# Patient Record
Sex: Female | Born: 1985 | Race: Black or African American | Hispanic: No | Marital: Single | Smoking: Never smoker
Health system: Southern US, Community
[De-identification: ages and names within clinical notes are randomized; demographics above are authoritative.]

## PROBLEM LIST (undated history)

## (undated) ENCOUNTER — Emergency Department (HOSPITAL_COMMUNITY): Discharge status: Against Medical Advice | Payer: 59

## (undated) ENCOUNTER — Emergency Department: Discharge status: Absent without leave | Payer: Self-pay

## (undated) DIAGNOSIS — B009 Herpesviral infection, unspecified: Secondary | ICD-10-CM

## (undated) DIAGNOSIS — N63 Unspecified lump in unspecified breast: Secondary | ICD-10-CM

## (undated) DIAGNOSIS — D649 Anemia, unspecified: Secondary | ICD-10-CM

## (undated) HISTORY — PX: OTHER SURGICAL HISTORY: SHX169

## (undated) HISTORY — DX: Unspecified lump in unspecified breast: N63.0

## (undated) HISTORY — DX: Anemia, unspecified: D64.9

## (undated) HISTORY — DX: Herpesviral infection, unspecified: B00.9

---

## 2005-04-20 ENCOUNTER — Ambulatory Visit: Payer: Self-pay | Admitting: Internal Medicine

## 2005-05-15 ENCOUNTER — Ambulatory Visit: Payer: Self-pay | Admitting: Internal Medicine

## 2005-06-22 ENCOUNTER — Emergency Department: Payer: Self-pay | Admitting: Emergency Medicine

## 2005-06-26 ENCOUNTER — Inpatient Hospital Stay: Payer: Self-pay

## 2006-12-08 ENCOUNTER — Ambulatory Visit: Payer: Self-pay

## 2012-05-30 ENCOUNTER — Ambulatory Visit (INDEPENDENT_AMBULATORY_CARE_PROVIDER_SITE_OTHER): Payer: 59 | Admitting: Internal Medicine

## 2012-05-30 ENCOUNTER — Encounter: Payer: Self-pay | Admitting: Internal Medicine

## 2012-05-30 ENCOUNTER — Encounter: Payer: Self-pay | Admitting: *Deleted

## 2012-05-30 VITALS — BP 110/70 | HR 73 | Temp 97.9°F | Ht 71.0 in | Wt 144.0 lb

## 2012-05-30 DIAGNOSIS — R51 Headache: Secondary | ICD-10-CM

## 2012-05-30 DIAGNOSIS — R519 Headache, unspecified: Secondary | ICD-10-CM | POA: Insufficient documentation

## 2012-05-30 NOTE — Assessment & Plan Note (Signed)
Goes back for 6-9 months  Atypical for migraine---doesn't seem to be this No caffeine intake of note Doesn't seem to be stress headaches On OCP for about a year---switched from depo due to breakthrough bleeding. Is having period now  Discussed options Would recommend ibuprofen to try to abort May want to consider stopping OCP if they continue (?back to depo, or consider mirena)

## 2012-05-30 NOTE — Patient Instructions (Signed)
Please try ibuprofen for the headache if they bother you enough. If the headaches continue, you may need to consider changing off the birth control pill

## 2012-05-30 NOTE — Progress Notes (Signed)
Subjective:    Patient ID: Miranda Duncan, female    DOB: [birth year], 26 y.o.   MRN: [id number]  HPI Establishing care here  Has been having headache for 3-4 days Centered bifrontally Tried tylenol or ibuprofen without success Has had recurrent problems for 6-8 months Now more frequent Had visual spots in both eyes on Friday (3 days ago). No clear aura Pounding headaches No nausea Does have photophobia but no sonophobia Pain is worse when lying down then sitting. No increase with movement  Works at computer all day No neck stiffness or pain  Evaluated for fever and headaches at [facility] some years ago---LP negative No migraines  Drinks occ caffeine--but not daily Usually caffeine free sprite  Current Outpatient Prescriptions on File Prior to Visit  Medication Sig Dispense Refill  . norethindrone-ethinyl estradiol (TRIPHASIL,CYCLAFEM,ALYACEN) 0.5/0.75/1-35 MG-MCG tablet Take 1 tablet by mouth daily.        No Known Allergies  Past Medical History  Diagnosis Date  . Anemia     during pregnancy    No past surgical history on file.  Family History  Problem Relation Age of Onset  . Diabetes Paternal Grandmother   . Hypertension Paternal Grandmother   . Diabetes Paternal Grandfather   . Cancer Paternal Grandfather     prostate  . Heart disease Neg Hx   . Migraines Neg Hx     History   Social History  . Marital Status: Single    Spouse Name: N/A    Number of Children: 1  . Years of Education: N/A   Occupational History  . Customer service [employer]   Social History Main Topics  . Smoking status: Never Smoker   . Smokeless tobacco: Never Used  . Alcohol Use: Yes     rare--- once or twice a month  . Drug Use: No  . Sexually Active: Not on file   Other Topics Concern  . Not on file   Social History Narrative  . No narrative on file   Review of Systems  Constitutional: Negative for fever, fatigue and unexpected weight change.  HENT: Negative for  congestion, rhinorrhea and dental problem.        No allergy problems  Eyes: Positive for photophobia. Negative for visual disturbance.       No diplopia  Respiratory: Negative for cough and shortness of breath.   Cardiovascular: Negative for chest pain and leg swelling.  Gastrointestinal: Negative for nausea, vomiting and abdominal pain.  Genitourinary: Negative for dysuria and difficulty urinating.       Periods regular with OCP Long time on this Followed at [facility]  Musculoskeletal: Negative for back pain and arthralgias.  Skin: Negative for color change and rash.  Neurological: Positive for headaches. Negative for tremors, syncope, speech difficulty, weakness, light-headedness and numbness.  Hematological: Negative for adenopathy. Does not bruise/bleed easily.  Psychiatric/Behavioral: Negative for disturbed wake/sleep cycle and dysphoric mood. The patient is not nervous/anxious.        Generally sleeps okay       Objective:   Physical Exam  Constitutional: She appears well-developed and well-nourished. No distress.  HENT:  Head: Normocephalic and atraumatic.  Right Ear: External ear normal.  Left Ear: External ear normal.  Mouth/Throat: Oropharynx is clear and moist. No oropharyngeal exudate.  Eyes: Conjunctivae normal and EOM are normal. Pupils are equal, round, and reactive to light.       Fundi and discs normal  Neck: Normal range of motion. Neck supple. No thyromegaly  present.  Cardiovascular: Normal rate, regular rhythm, normal heart sounds and intact distal pulses.  Exam reveals no gallop.   No murmur heard. Pulmonary/Chest: Effort normal and breath sounds normal. No respiratory distress. She has no wheezes. She has no rales.  Abdominal: Soft. There is no tenderness.  Musculoskeletal: Normal range of motion. She exhibits no edema and no tenderness.  Lymphadenopathy:    She has no cervical adenopathy.  Neurological: She is alert. She has normal strength. She displays  no atrophy and no tremor. No cranial nerve deficit. She exhibits normal muscle tone. She displays a negative Romberg sign. Coordination and gait normal.  Skin: No rash noted. No erythema.  Psychiatric: She has a normal mood and affect. Her behavior is normal. Thought content normal.          Assessment & Plan:

## 2012-12-06 ENCOUNTER — Ambulatory Visit: Payer: Self-pay | Admitting: Obstetrics and Gynecology

## 2012-12-06 ENCOUNTER — Encounter: Payer: 59 | Admitting: Internal Medicine

## 2012-12-07 ENCOUNTER — Other Ambulatory Visit: Payer: Self-pay

## 2012-12-07 ENCOUNTER — Ambulatory Visit (INDEPENDENT_AMBULATORY_CARE_PROVIDER_SITE_OTHER): Payer: 59 | Admitting: General Surgery

## 2012-12-07 ENCOUNTER — Encounter: Payer: Self-pay | Admitting: General Surgery

## 2012-12-07 VITALS — BP 110/72 | HR 72 | Resp 12 | Ht 71.0 in | Wt 146.0 lb

## 2012-12-07 DIAGNOSIS — N63 Unspecified lump in unspecified breast: Secondary | ICD-10-CM

## 2012-12-07 DIAGNOSIS — D241 Benign neoplasm of right breast: Secondary | ICD-10-CM

## 2012-12-07 DIAGNOSIS — D249 Benign neoplasm of unspecified breast: Secondary | ICD-10-CM

## 2012-12-07 HISTORY — PX: BREAST BIOPSY: SHX20

## 2012-12-07 NOTE — Patient Instructions (Addendum)

## 2012-12-07 NOTE — Progress Notes (Signed)
Patient ID: Miranda Duncan, female   DOB: Aug 17, 1986, 27 y.o.   MRN: [id number]  Chief Complaint  Patient presents with  . Breast Problem    HPI Miranda Duncan is a 27 y.o. female.  Patient here today for right breast evaluation.  Patient with known history of "fibroids" in breast since 2006.  No biopsies done.  Patient has previously felt this "lump" and has not noticed any changes in size. Ultrasound done 12-06-12.  No family history of breast cancer. HPI  Past Medical History  Diagnosis Date  . Anemia     during pregnancy    History reviewed. No pertinent past surgical history.  Family History  Problem Relation Age of Onset  . Diabetes Paternal Grandmother   . Hypertension Paternal Grandmother   . Diabetes Paternal Grandfather   . Cancer Paternal Grandfather     prostate  . Heart disease Neg Hx   . Migraines Neg Hx     Social History History  Substance Use Topics  . Smoking status: Never Smoker   . Smokeless tobacco: Never Used  . Alcohol Use: Yes     Comment: rare--- once or twice a month    No Known Allergies  Current Outpatient Prescriptions  Medication Sig Dispense Refill  . ibuprofen (ADVIL,MOTRIN) 200 MG tablet Take 400-600 mg by mouth 3 (three) times daily as needed. For headache      . TRI-SPRINTEC 0.18/0.215/0.25 MG-35 MCG tablet Take 1 tablet by mouth daily.       No current facility-administered medications for this visit.    Review of Systems Review of Systems  Constitutional: Negative.   Respiratory: Negative.   Cardiovascular: Negative.     Blood pressure 110/72, pulse 72, resp. rate 12, height 5\' 11"  (1.803 m), weight 146 lb (66.225 kg), last menstrual period 11/28/2012.  Physical Exam Physical Exam  Constitutional: She is oriented to person, place, and time. She appears well-developed and well-nourished.  Cardiovascular: Normal rate and regular rhythm.   Pulmonary/Chest: Effort normal and breath sounds normal. Right breast exhibits  mass. Right breast exhibits no inverted nipple, no nipple discharge, no skin change and no tenderness. Left breast exhibits no inverted nipple, no mass, no nipple discharge, no skin change and no tenderness.  Lymphadenopathy:    She has no cervical adenopathy.    She has no axillary adenopathy.  Neurological: She is alert and oriented to person, place, and time.  Skin: Skin is warm and dry.   4 areas in right breast identified on clinical exam Data Reviewed Ultrasound examination of the right breast dated 12/06/2012 described a 0.9 x 2.8 x 4.2 cm well circumscribed hypoechoic oval mass at the 7:00 position possible internal calcifications. BIRAD-4.  Ultrasound examination of the right breast dated 04/13/2005 identified a 2.2 x 2.6 x 3.7 cm hypoechoic mass at the 7:00 position. Lesion at the 9:00 position measured 0.9 x 1.2 x 1.6 cm at the 11:00 position 1.8 x 2.4 x 3.5 cm and at the 5:00 position a 1.4 x 1.7 x 1.9 cm mass. These all had a similar appearance of echo architecture.   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    Plan    The patient is a candidate for core biopsy to confirm the clinical impression of a benign process. The core biopsy procedure was reviewed with the patient and she was amenable to proceed today       [name] 12/10/2012, 9:48 AM

## 2012-12-08 ENCOUNTER — Telehealth: Payer: Self-pay | Admitting: General Surgery

## 2012-12-08 LAB — PATHOLOGY

## 2012-12-08 NOTE — Telephone Encounter (Signed)
Patient notified that the biopsy of the largest mass in the right breast completed yesterday confirmed the clinical impression of a fibroadenoma. She reported minimal discomfort from the procedure. She will follow up with the nurse in one week for a wound check, Arrangements are in place for a follow office exam and ultrasound in one year. The patient will call if any new areas of concern develop.

## 2012-12-10 ENCOUNTER — Encounter: Payer: Self-pay | Admitting: General Surgery

## 2012-12-10 DIAGNOSIS — D249 Benign neoplasm of unspecified breast: Secondary | ICD-10-CM | POA: Insufficient documentation

## 2012-12-10 NOTE — Procedures (Signed)
Ultrasound examination of the right breast was completed.   At the 4:00 position 6 cm from the nipple a 0.7 x 2.7 x 3.4 cm hypoechoic mass with smooth borders was identified. This was previously identified as a 1.4 x 1.7 x 1.9 cm mass in 2006.  At the 7:00 position the index lesion prompting referral measured 1.1 x 3.4 x 4.0 cm 4 cm from the nipple. Previously this had measured 2.3 x 2.6 x 3.7 cm in 2006. This lesion did undergo core biopsy today.  At the 8:00 position a 0.7 x 1.5 x 1.9 cm mass was identified 6 cm from the nipple, previously this measure 1.0 x 1.2 x 1.6 cm in 2006.   At the 10:00 position a 0.9 x 3.0 x 3.2 cm hypoechoic mass with smooth borders was identified 8 cm from the nipple. This previously 1.8 x 2.4 x 3.5 cm in 2006.   The breast biopsy procedure was reviewed with the patient. The potential for bleeding, infection, and pain was reviewed. At this time, the benefits outweigh the risk and the patient is amenable to proceed. FINESSE BREAST BIOPSY REPORT  Name:  MAGALY POLLINA DOB:  April 22, 1986  Vitals: BP 110/72  Pulse 72  Resp 12  Ht 5\' 11"  (1.803 m)  Wt 146 lb (66.225 kg)  BMI 20.37 kg/m2  LMP 11/28/2012  Lesion:  Multiple right breast lesions.  Location:  Right, 4:00, 7:00, 8:00 and 10:00 positions. Local anesthetic:   10cc's of 0.5% Xylocaine/0.25% Marcaine with 1:200,000 epinephrine and NaH2CO3   Prep:  Betadine solution  Finesse biopsy device:  14 gauge  Patient tolerance:  Patient tolerated the procedure well   Approach: LM  Dressing: Benzoin, Steri-Strips, Telfa and Tegaderm  Clip: Not utilized  Ice pack applied. Written instructions provided to patient regarding wound care.   Patient advised that patient will be contacted by phone when pathology report is available.  Follow blood has been scheduled.   CC: Tillman Abide, MD, Alda Lea, Merrily Pew

## 2012-12-14 ENCOUNTER — Ambulatory Visit (INDEPENDENT_AMBULATORY_CARE_PROVIDER_SITE_OTHER): Payer: 59 | Admitting: *Deleted

## 2012-12-14 DIAGNOSIS — D249 Benign neoplasm of unspecified breast: Secondary | ICD-10-CM

## 2012-12-14 DIAGNOSIS — D241 Benign neoplasm of right breast: Secondary | ICD-10-CM

## 2012-12-14 NOTE — Progress Notes (Signed)
Patient here today for follow up post right breast biopsy.  Dressing removed, steristrip in place and aware it may come off in one week.  Minimal to no bruising noted.  The patient is aware that a heating pad may be used for comfort as needed.  Aware of pathology. Follow up as scheduled one year.

## 2012-12-14 NOTE — Patient Instructions (Signed)
Follow-up in one year.

## 2013-01-25 ENCOUNTER — Encounter: Payer: 59 | Admitting: Internal Medicine

## 2013-12-12 ENCOUNTER — Other Ambulatory Visit: Payer: 59

## 2013-12-12 ENCOUNTER — Encounter: Payer: Self-pay | Admitting: General Surgery

## 2013-12-12 ENCOUNTER — Ambulatory Visit (INDEPENDENT_AMBULATORY_CARE_PROVIDER_SITE_OTHER): Payer: 59 | Admitting: General Surgery

## 2013-12-12 VITALS — BP 104/70 | HR 68 | Resp 12 | Ht 71.0 in | Wt 148.0 lb

## 2013-12-12 DIAGNOSIS — N63 Unspecified lump in unspecified breast: Secondary | ICD-10-CM

## 2013-12-12 DIAGNOSIS — D241 Benign neoplasm of right breast: Secondary | ICD-10-CM

## 2013-12-12 DIAGNOSIS — D249 Benign neoplasm of unspecified breast: Secondary | ICD-10-CM

## 2013-12-12 NOTE — Progress Notes (Signed)
Patient ID: AAIRAH NEGRETTE, female   DOB: Sep 14, 1986, 28 y.o.   MRN: [id number]  No chief complaint on file.   HPI Miranda Duncan is a 28 y.o. female.  Here today for follow up right breast mass and ultrasiound.  She states it has not changed in size. No new breast complaints.   HPI  Past Medical History  Diagnosis Date  . Anemia     during pregnancy    Past Surgical History  Procedure Laterality Date  . Breast biopsy Right 12-07-12    benign    Family History  Problem Relation Age of Onset  . Diabetes Paternal Grandmother   . Hypertension Paternal Grandmother   . Diabetes Paternal Grandfather   . Cancer Paternal Grandfather     prostate  . Heart disease Neg Hx   . Migraines Neg Hx     Social History History  Substance Use Topics  . Smoking status: Never Smoker   . Smokeless tobacco: Never Used  . Alcohol Use: Yes     Comment: rare--- once or twice a month    No Known Allergies  Current Outpatient Prescriptions  Medication Sig Dispense Refill  . Vitamin D, Ergocalciferol, (DRISDOL) 50000 UNITS CAPS capsule Take 50,000 Units by mouth every 7 (seven) days.      Marland Kitchen ibuprofen (ADVIL,MOTRIN) 200 MG tablet Take 400-600 mg by mouth 3 (three) times daily as needed. For headache      . TRI-SPRINTEC 0.18/0.215/0.25 MG-35 MCG tablet Take 1 tablet by mouth daily.       No current facility-administered medications for this visit.    Review of Systems Review of Systems  Constitutional: Negative.   Respiratory: Negative.   Cardiovascular: Negative.     Blood pressure 104/70, pulse 68, resp. rate 12, height 5\' 11"  (1.803 m), weight 148 lb (67.132 kg), last menstrual period 12/10/2013.  Physical Exam Physical Exam  Constitutional: She is oriented to person, place, and time. She appears well-developed and well-nourished.  Neck: Neck supple.  Cardiovascular: Normal rate, regular rhythm and normal heart sounds.   Pulmonary/Chest: Effort normal and breath sounds  normal. Right breast exhibits mass. Right breast exhibits no inverted nipple, no nipple discharge, no skin change and no tenderness. Left breast exhibits no inverted nipple, no mass, no nipple discharge, no skin change and no tenderness.  Left < right breast about 1/4 cup size Right breast lump at 5 o'clock and 7- 8 o'clock 10 o'clock   Lymphadenopathy:    She has no cervical adenopathy.    She has no axillary adenopathy.  Neurological: She is alert and oriented to person, place, and time.  Skin: Skin is warm and dry.    Data Reviewed Diagnosis: BREAST, RIGHT 7:00 ULTRASOUND GUIDED CORE BIOPSY *STAT*: - BENIGN BREAST TISSUE WITH FRAGMENTS OF SCLEROTIC FIBROADENOMATOUS CHANGES, CONSISTENT WITH FIBROADENOMA. NOTE: These results were called to Dr. Bary Castilla at 1420 on 12/08/12. RVA/12/08/2012  Ultrasound examination of the right breast was completed to determine if any interval change had occurred in the multiple previous identified fibroadenomas. At the 4 o'clock position of the right breast 6 cm from the nipple a 0.5 x 2.31 x 3.15 cm smoothly marginated hypoechoic mass is identified. This previously measured 0.7 x 2.4 x 3.4 cm in March 2014, 1.4 x 1.7 x 1.9 cm in 2006. No significant interval change in the past year.  In the right breast at the 7 o'clock position, 4 cm in the nipple is 0.9 x 2.8 x 2.9 cm  smoothly marginated hypoechoic mass with evidence of the biopsy clip is identified. This previously measured 1.1 x 3.4 x 4.0 cm. Decreased size since core biopsy one year ago. At the 8 o'clock position 6 cm in the nipple a 0.8 x 1.6 x 2.1 cm areas identified. This previously measured 0.7 x 1.5 x 1.9 cm. 2 mm change over the past year.  At the 10 o'clock position a 1.0 x 2.7 x 3.1 cm hypoechoic mass with smooth borders as noted. This previously measured 0.9 x 3.0 x 3.2 cm. Mild decrease in size.   Assessment    Multiple right breast fibroadenomas. Most smaller than in the past, 2 mm increase  in one lesion.     Plan    The patient remains asymptomatic in regards to breast pain. The mild asymmetry (the right breast slightly larger the left) is managed with the use of padded bras.  The patient will continue monthly breast self-examination report if any changes occur. As there's been no significant change over the past year, and previous biopsy showed fibroadenomatous changes, observation is reasonable. The patient will contact the office if she has any concerns.      PCP: Barbette Hair 12/12/2013, 9:46 PM

## 2013-12-12 NOTE — Patient Instructions (Addendum)
Continue self breast exams. Call office for any new breast issues or concerns. Breast Self-Awareness Practicing breast self-awareness may pick up problems early, prevent significant medical complications, and possibly save your life. By practicing breast self-awareness, you can become familiar with how your breasts look and feel and if your breasts are changing. This allows you to notice changes early. It can also offer you some reassurance that your breast health is good. One way to learn what is normal for your breasts and whether your breasts are changing is to do a breast self-exam. If you find a lump or something that was not present in the past, it is best to contact your caregiver right away. Other findings that should be evaluated by your caregiver include nipple discharge, especially if it is bloody; skin changes or reddening; areas where the skin seems to be pulled in (retracted); or new lumps and bumps. Breast pain is seldom associated with cancer (malignancy), but should also be evaluated by a caregiver. HOW TO PERFORM A BREAST SELF-EXAM The best time to examine your breasts is 5 7 days after your menstrual period is over. During menstruation, the breasts are lumpier, and it may be more difficult to pick up changes. If you do not menstruate, have reached menopause, or had your uterus removed (hysterectomy), you should examine your breasts at regular intervals, such as monthly. If you are breastfeeding, examine your breasts after a feeding or after using a breast pump. Breast implants do not decrease the risk for lumps or tumors, so continue to perform breast self-exams as recommended. Talk to your caregiver about how to determine the difference between the implant and breast tissue. Also, talk about the amount of pressure you should use during the exam. Over time, you will become more familiar with the variations of your breasts and more comfortable with the exam. A breast self-exam requires you to  remove all your clothes above the waist. 1. Look at your breasts and nipples. Stand in front of a mirror in a room with good lighting. With your hands on your hips, push your hands firmly downward. Look for a difference in shape, contour, and size from one breast to the other (asymmetry). Asymmetry includes puckers, dips, or bumps. Also, look for skin changes, such as reddened or scaly areas on the breasts. Look for nipple changes, such as discharge, dimpling, repositioning, or redness. 2. Carefully feel your breasts. This is best done either in the shower or tub while using soapy water or when flat on your back. Place the arm (on the side of the breast you are examining) above your head. Use the pads (not the fingertips) of your three middle fingers on your opposite hand to feel your breasts. Start in the underarm area and use  inch (2 cm) overlapping circles to feel your breast. Use 3 different levels of pressure (light, medium, and firm pressure) at each circle before moving to the next circle. The light pressure is needed to feel the tissue closest to the skin. The medium pressure will help to feel breast tissue a little deeper, while the firm pressure is needed to feel the tissue close to the ribs. Continue the overlapping circles, moving downward over the breast until you feel your ribs below your breast. Then, move one finger-width towards the center of the body. Continue to use the  inch (2 cm) overlapping circles to feel your breast as you move slowly up toward the collar bone (clavicle) near the base of the neck.  neck. Continue the up and down exam using all 3 pressures until you reach the middle of the chest. Do this with each breast, carefully feeling for lumps or changes. 3.  Keep a written record with breast changes or normal findings for each breast. By writing this information down, you do not need to depend only on memory for size, tenderness, or location. Write down where you are in your  menstrual cycle, if you are still menstruating. Breast tissue can have some lumps or thick tissue. However, see your caregiver if you find anything that concerns you.  SEEK MEDICAL CARE IF:  You see a change in shape, contour, or size of your breasts or nipples.   You see skin changes, such as reddened or scaly areas on the breasts or nipples.   You have an unusual discharge from your nipples.   You feel a new lump or unusually thick areas.  Document Released: 08/31/2005 Document Revised: 08/17/2012 Document Reviewed: 12/16/2011 [employer] Patient Information 2014 [employer].  

## 2014-07-16 ENCOUNTER — Encounter: Payer: Self-pay | Admitting: General Surgery

## 2014-09-23 IMAGING — US ULTRASOUND RIGHT BREAST
1 series · 14 of 25 positions shown · non-contrast
Comparison: none

REASON FOR EXAM: RT BREAST MASS AT 7 O CLOCK
COMMENTS:

[Series 1: ultrasound right breast · 0.08mm/px · 14 of 26 slices shown]
[im 1/26]
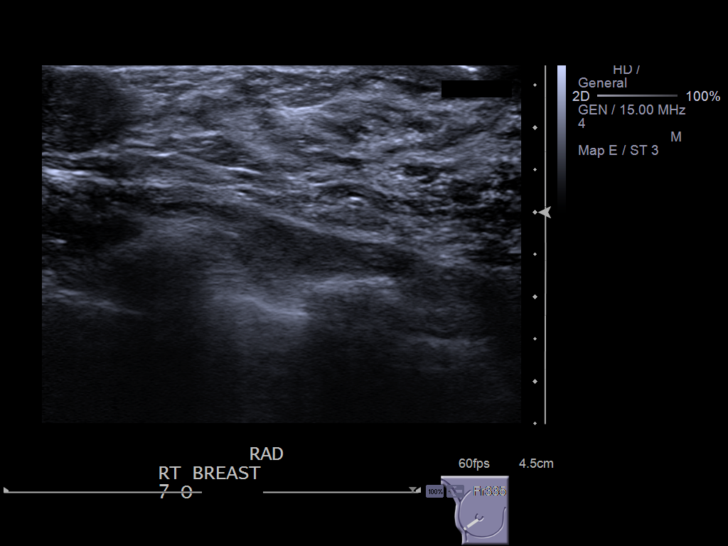
[im 3/26]
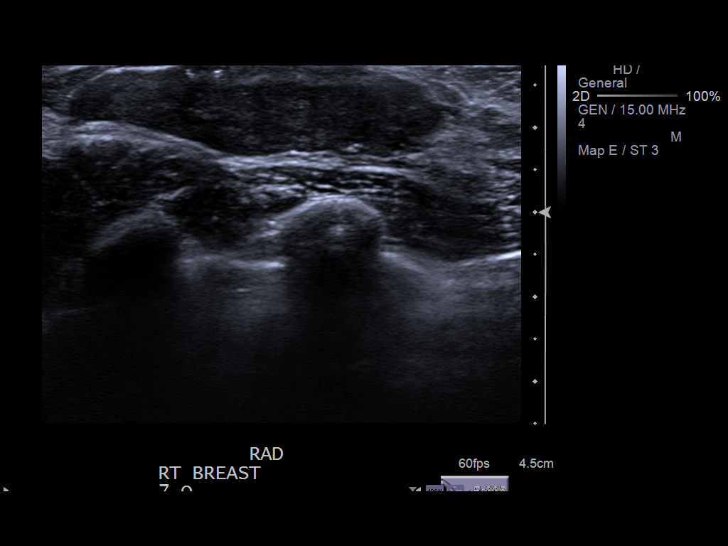
[im 5/26]
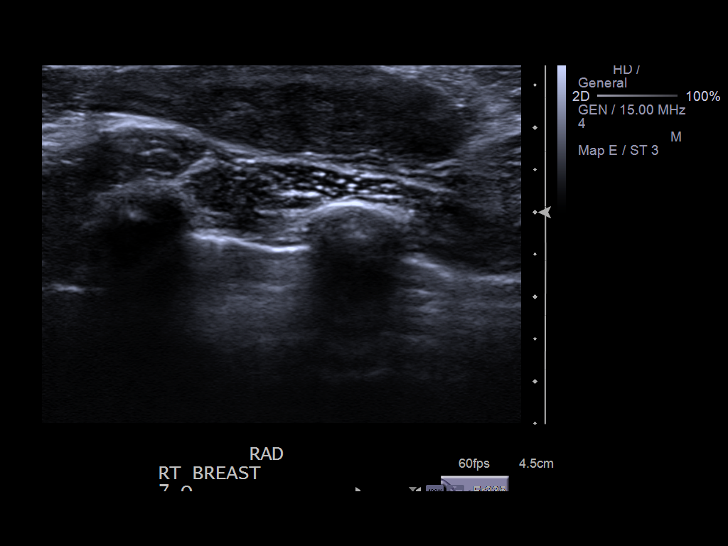
[im 7/26]
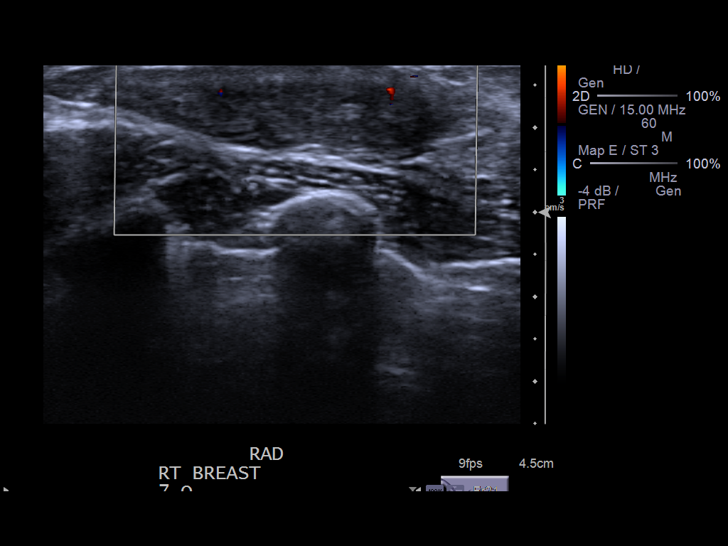
[im 9/26]
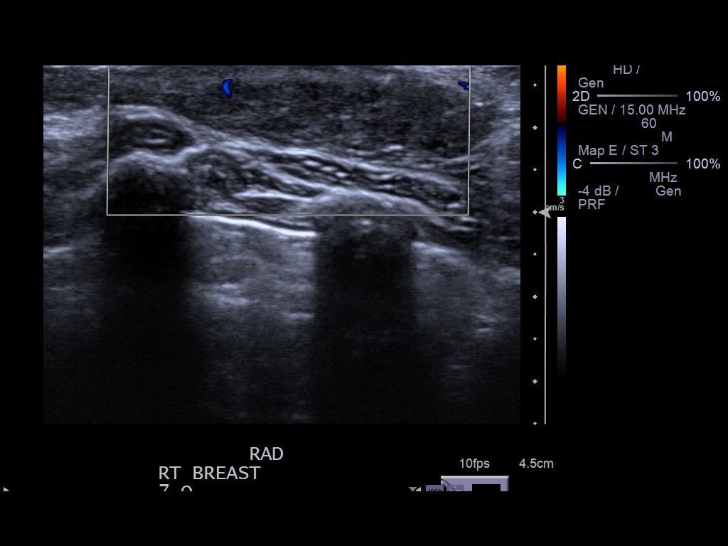
[im 10/26]
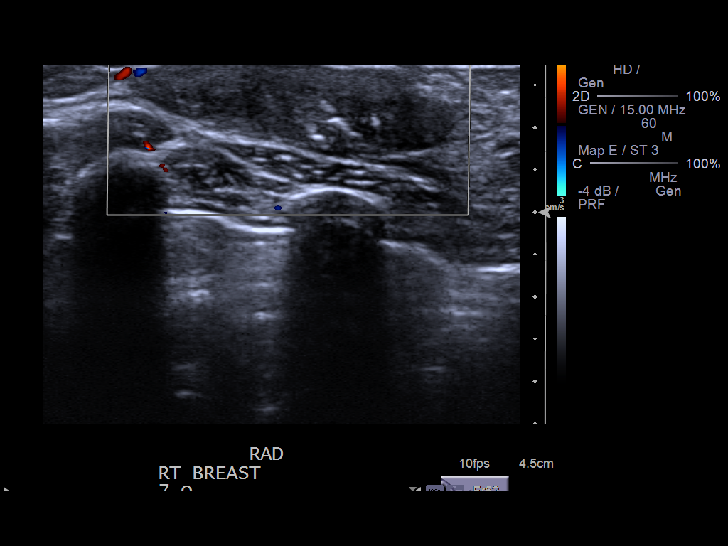
[im 12/26]
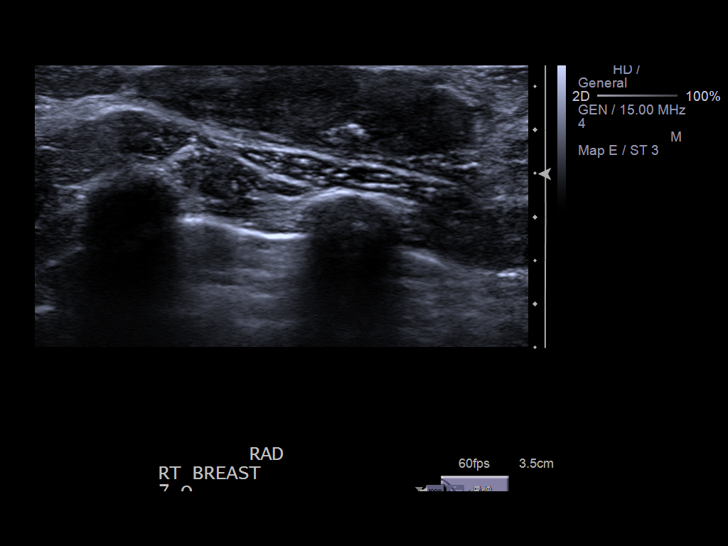
[im 14/26]
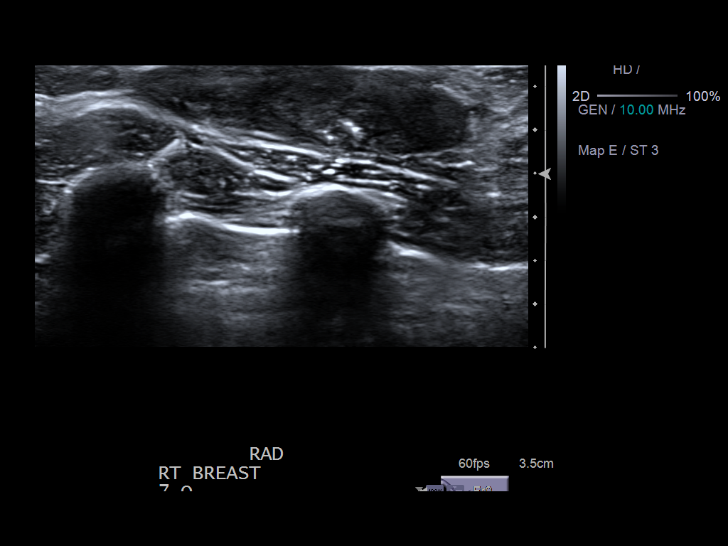
[im 16/26]
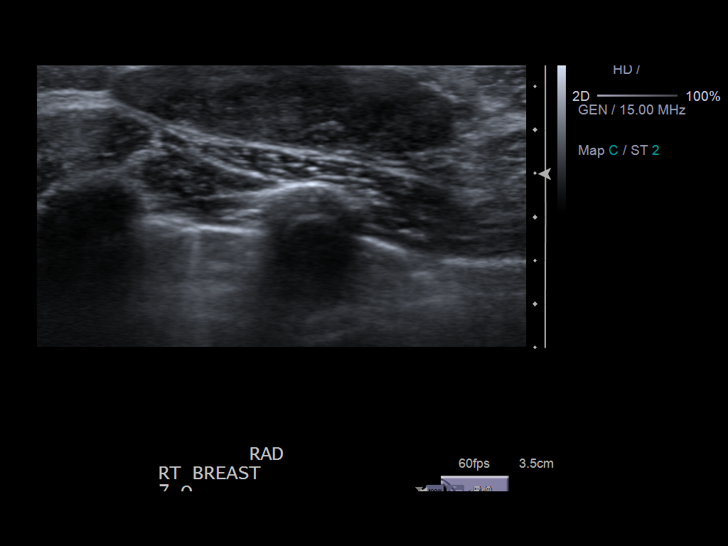
[im 17/26]
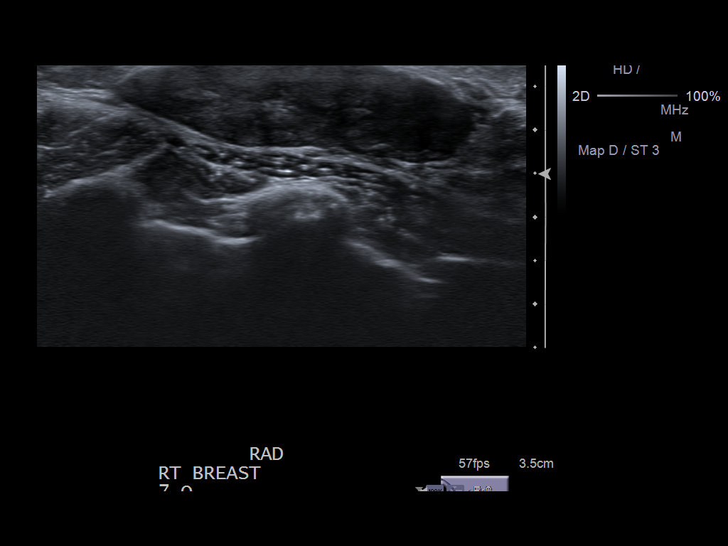
[im 19/26]
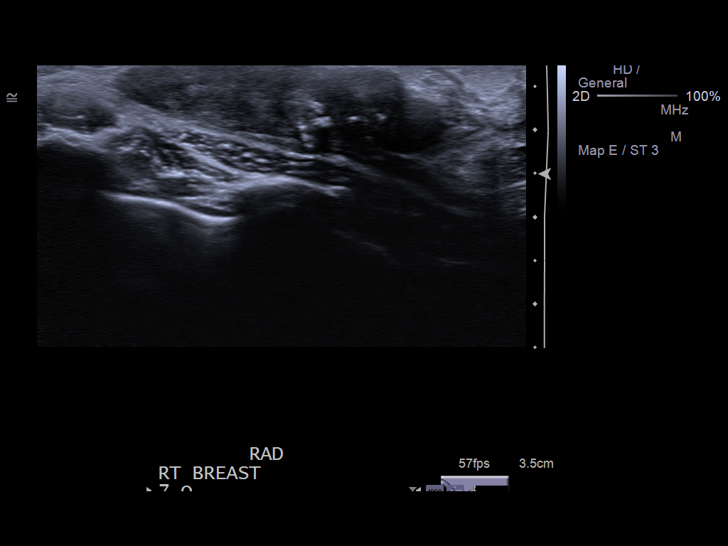
[im 21/26]
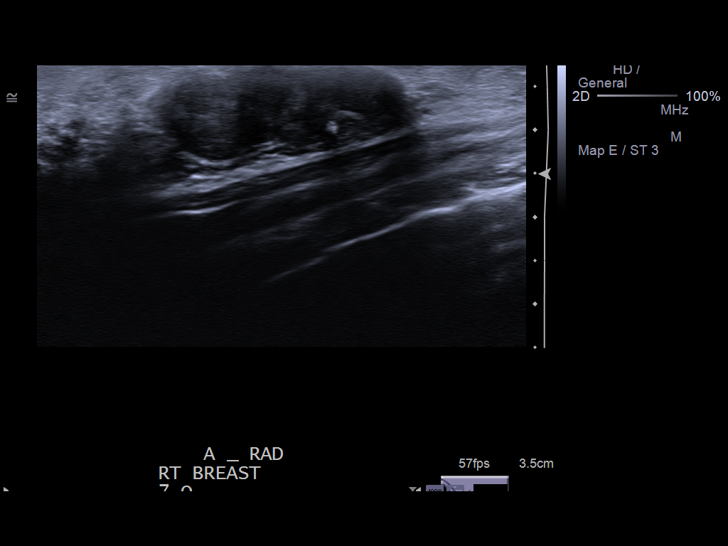
[im 23/26]
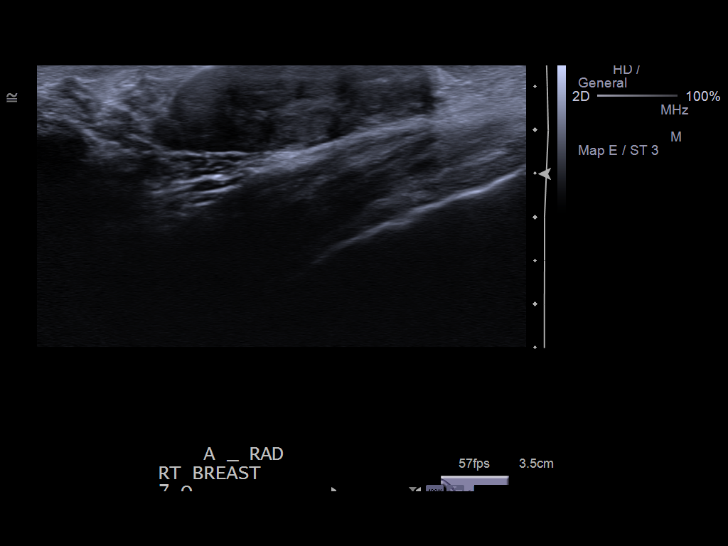
[im 26/26]
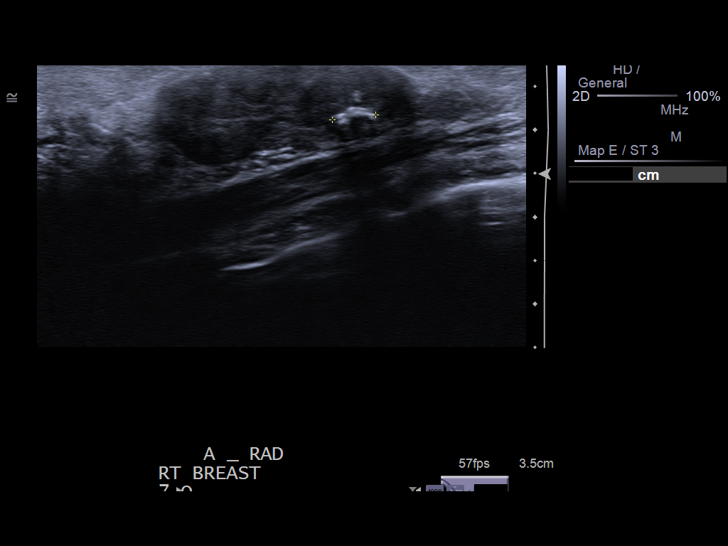

[14 of 25 positions shown; findings below may reference images not displayed]

PROCEDURE:     US  - US BREAST RIGHT  - December 06, 2012  [DATE]

RESULT:     Comparison: None.

Technique and Findings:
Multiple grayscale and color Doppler images were obtained of the
inferolateral right breast an approximately [DATE] in the region of reported
palpable abnormality with a high-frequency linear transducer.

At the site of reported palpable abnormality at [DATE], there is a
well-circumscribed hypoechoic oval mass which is wider than tall. It
measures 4.2 x 0.9 x 2.8 cm. There are a few hyperechoic foci within the
mass which could represent calcifications.
IMPRESSION: BIRADS 4: Suspicious Abnormality.

Surgical consultation and tissue diagnosis are recommended for the
indeterminate hypoechoic mass in the inferior lateral right breast at the
site of reported palpable abnormality. The mass is indeterminate, but
differential considerations would include a fibroadenoma.

[REDACTED]

## 2015-07-29 ENCOUNTER — Encounter: Payer: Self-pay | Admitting: General Surgery

## 2015-07-29 ENCOUNTER — Ambulatory Visit (INDEPENDENT_AMBULATORY_CARE_PROVIDER_SITE_OTHER): Payer: 59 | Admitting: General Surgery

## 2015-07-29 ENCOUNTER — Other Ambulatory Visit: Payer: 59

## 2015-07-29 VITALS — BP 116/74 | HR 75 | Resp 14 | Ht 71.0 in | Wt 141.0 lb

## 2015-07-29 DIAGNOSIS — D241 Benign neoplasm of right breast: Secondary | ICD-10-CM

## 2015-07-29 DIAGNOSIS — D249 Benign neoplasm of unspecified breast: Secondary | ICD-10-CM | POA: Insufficient documentation

## 2015-07-29 DIAGNOSIS — N6001 Solitary cyst of right breast: Secondary | ICD-10-CM

## 2015-07-29 NOTE — Progress Notes (Signed)
Patient ID: Miranda Duncan, female   DOB: Jun 26, 1986, 29 y.o.   MRN: [id number]  Chief Complaint  Patient presents with  . Other    right breast cyst    HPI Miranda Duncan is a 29 y.o. female here today for a evaluation of a right breast cyst. Patient was seen in 2015 for this same thing. She states the right breast is sore and tender to touch. She states that it worsens about a week prior to her cycle.  HPI  Past Medical History  Diagnosis Date  . Anemia     during pregnancy    Past Surgical History  Procedure Laterality Date  . Breast biopsy Right 12-07-12    benign    Family History  Problem Relation Age of Onset  . Diabetes Paternal Grandmother   . Hypertension Paternal Grandmother   . Diabetes Paternal Grandfather   . Cancer Paternal Grandfather     prostate  . Heart disease Neg Hx   . Migraines Neg Hx     Social History Social History  Substance Use Topics  . Smoking status: Never Smoker   . Smokeless tobacco: Never Used  . Alcohol Use: Yes     Comment: rare--- once or twice a month    No Known Allergies  No current outpatient prescriptions on file.   No current facility-administered medications for this visit.    Review of Systems Review of Systems  Constitutional: Negative.   Respiratory: Negative.   Cardiovascular: Negative.     Blood pressure 116/74, pulse 75, resp. rate 14, height 5\' 11"  (1.803 m), weight 141 lb (63.957 kg), last menstrual period 07/28/2015.  Physical Exam Physical Exam  Constitutional: She is oriented to person, place, and time. She appears well-developed and well-nourished.  Eyes: Conjunctivae are normal. No scleral icterus.  Neck: Neck supple.  Cardiovascular: Normal rate, regular rhythm and normal heart sounds.   Pulmonary/Chest: Effort normal and breath sounds normal. Right breast exhibits no inverted nipple, no nipple discharge, no skin change and no tenderness. Left breast exhibits no inverted nipple, no mass, no  nipple discharge, no skin change and no tenderness. Breast asymmetry: right breats hallf cup size bigger than left.    2 cm nodular at 5,8 and 10 o'clock right breast    Right breast 1/2 cup size larger than the left.  Lymphadenopathy:    She has no cervical adenopathy.  Neurological: She is alert and oriented to person, place, and time.  Skin: Skin is warm and dry.    Data Reviewed Ultrasound examination of the right breast was completed to assess the previously noted nodules.  At the 4:00 position 6 cm from the nipple a 0.9 x 2.19 x 2.62 smoothly marginated hypoechoic mass resting on the underlying pectoralis fascia with a few homogeneous internal echoes is noted. This lesion is consistent with a fibroadenoma. No significant change in volume.  At the 7:00 position, 5 cm from the nipple a 1.0 x 3.16 x 3.26 smoothly marginated hypoechoic area with evidence of an internal calcification/biopsy clip measuring 0.4 x 0.48 cm in diameter was noted. Minimal change involving him.  At the 10:00 position a 0.8 x 2.89 x 3.15 cm smoothly marginated hypoechoic structure with a homogeneous internal echo pattern with a suspected calcification measuring up to 0.24 cm was identified. No significant change in volume.    At the time of the March 2015 exam the following measurements were obtained:  At the 4:00 position: 0.5 x 2.3 x  3.15 cm.  At the 7:00 position 0.9 x 2.8 x 2.9 cm. Biopsy clip is evident.  At the 10:00 position 1.0 x 2.7 x 3.1 cm.  At the time of the March 2014 exam the following measurements were obtained:  At the 4:00 position 0.7 x 2.4 x 3.4 cm.  At the 7:00 position 1.1 x 3.4 x 4.0 cm.  At the 10:00 position 0.9 x 3.0 x 3.2 cm.  Core biopsy results from March 2014:  BREAST, RIGHT 7:00 ULTRASOUND GUIDED CORE BIOPSY *STAT*: - BENIGN BREAST TISSUE WITH FRAGMENTS OF SCLEROTIC FIBROADENOMATOUS CHANGES, CONSISTENT WITH FIBROADENOMA. NOTE: These results were called to Dr.  Bary Castilla at 1420 on 12/08/12.   Assessment    Multiple right breast fibroadenomas. No significant interval change.     Plan    The patient remains asymptomatic. The left breast lesion at 7:00 which brought the patient to attention at this time shows no significant interval change. Previous biopsy was benign.  The patient was encouraged to call should any discomfort occur. Follow up otherwise will be on an as-needed basis.      Patient to return as needed.  PCP:  Racheal Patches 07/29/2015, 8:00 PM

## 2015-07-30 ENCOUNTER — Telehealth: Payer: Self-pay

## 2015-07-30 NOTE — Telephone Encounter (Signed)
-----   Message from Robert Bellow, MD sent at 07/29/2015  8:10 PM EST ----- Please notify the patient is been no significant change in the lesion since 2014. She just develop any pain or areas of concern she is welcome to return any time for reassessment. Thank you

## 2015-07-30 NOTE — Telephone Encounter (Signed)
Notified patient as instructed, patient pleased. Discussed follow-up if needed, patient agrees.  

## 2016-04-14 DIAGNOSIS — B009 Herpesviral infection, unspecified: Secondary | ICD-10-CM

## 2016-04-14 HISTORY — DX: Herpesviral infection, unspecified: B00.9

## 2016-09-13 ENCOUNTER — Encounter: Payer: Self-pay | Admitting: Emergency Medicine

## 2016-09-13 ENCOUNTER — Ambulatory Visit
Admission: EM | Admit: 2016-09-13 | Discharge: 2016-09-13 | Disposition: A | Discharge status: Home / Self Care | Payer: 59 | Attending: Family Medicine | Admitting: Family Medicine

## 2016-09-13 DIAGNOSIS — J020 Streptococcal pharyngitis: Secondary | ICD-10-CM | POA: Diagnosis not present

## 2016-09-13 LAB — RAPID STREP SCREEN (MED CTR MEBANE ONLY): Streptococcus, Group A Screen (Direct): POSITIVE — AB

## 2016-09-13 MED ORDER — PENICILLIN V POTASSIUM 500 MG PO TABS: 500.0000 mg | ORAL_TABLET | Freq: Two times a day (BID) | ORAL | 0 refills | Status: AC

## 2016-09-13 MED ORDER — FLUCONAZOLE 150 MG PO TABS: 150.0000 mg | ORAL_TABLET | Freq: Every day | ORAL | 0 refills | Status: AC

## 2016-09-13 NOTE — ED Triage Notes (Signed)
Patient c/o sore throat and left ear pain that started yesterday.

## 2016-09-13 NOTE — ED Provider Notes (Signed)
CSN: [id number]     Arrival date & time 09/13/16  0840 History   First MD Initiated Contact with Patient 09/13/16 1047     Chief Complaint  Patient presents with  . Sore Throat  . Otalgia    left ear   (Consider location/radiation/quality/duration/timing/severity/associated sxs/prior Treatment) Patient is a 30 year old female, presents today for sore throat. Patient is concern for strep. Patient reports that her sore throat started yesterday with left ear pain. He also endorses fever. Patient denies cold symptoms such as congestion, coughing, sneezing, runny nose. Patient also denies headache or abdominal pain.       Past Medical History:  Diagnosis Date  . Anemia    during pregnancy   Past Surgical History:  Procedure Laterality Date  . BREAST BIOPSY Right 12-07-12   benign   Family History  Problem Relation Age of Onset  . Diabetes Paternal Grandmother   . Hypertension Paternal Grandmother   . Diabetes Paternal Grandfather   . Cancer Paternal Grandfather     prostate  . Heart disease Neg Hx   . Migraines Neg Hx    Social History  Substance Use Topics  . Smoking status: Never Smoker  . Smokeless tobacco: Never Used  . Alcohol use Yes     Comment: rare--- once or twice a month   OB History    Gravida Para Term Preterm AB Living   1 1           SAB TAB Ectopic Multiple Live Births                  Obstetric Comments   Age at first pregnancy 64 Age first menstrual period 4     Review of Systems  All other systems reviewed and are negative.   Allergies  Patient has no known allergies.  Home Medications   Prior to Admission medications   Medication Sig Start Date End Date Taking? Authorizing Provider  norgestimate-ethinyl estradiol (ORTHO-CYCLEN,SPRINTEC,PREVIFEM) 0.25-35 MG-MCG tablet Take 1 tablet by mouth daily.   Yes Historical Provider, MD  fluconazole (DIFLUCAN) 150 MG tablet Take 1 tablet (150 mg total) by mouth daily. 09/13/16 09/14/16  Barry Dienes,  NP  penicillin v potassium (VEETID) 500 MG tablet Take 1 tablet (500 mg total) by mouth 2 (two) times daily. 09/13/16 09/23/16  Barry Dienes, NP   Meds Ordered and Administered this Visit  Medications - No data to display  BP 114/60 (BP Location: Right Arm)   Pulse 98   Temp 99.9 F (37.7 C) (Oral)   Resp 16   Ht 5\' 11"  (1.803 m)   Wt 143 lb (64.9 kg)   LMP 08/30/2016 (Approximate)   SpO2 100%   BMI 19.94 kg/m  No data found.   Physical Exam  Constitutional: She is oriented to person, place, and time. She appears well-developed and well-nourished.  HENT:  Head: Normocephalic and atraumatic.  Right Ear: External ear normal.  Left Ear: External ear normal.  Nose: Nose normal.  Mouth/Throat: Oropharyngeal exudate present.  TMs pearly gray bilaterally with no concern for infection. Tonsils are swollen with exudate; left worse than right.  Eyes: EOM are normal. Pupils are equal, round, and reactive to light.  Neck: Normal range of motion. Neck supple.  Cardiovascular: Normal rate, regular rhythm and normal heart sounds.   Pulmonary/Chest: Effort normal and breath sounds normal.  Abdominal: Soft. Bowel sounds are normal.  Lymphadenopathy:    She has no cervical adenopathy.  Neurological: She is alert and  oriented to person, place, and time.  Skin: Skin is warm and dry.  Nursing note and vitals reviewed.   Urgent Care Course   Clinical Course     Procedures (including critical care time)  Labs Review Labs Reviewed  RAPID STREP SCREEN (NOT AT [facility]) - Abnormal; Notable for the following:       Result Value   Streptococcus, Group A Screen (Direct) POSITIVE (*)    All other components within normal limits    Imaging Review No results found.    MDM   1. Strep pharyngitis    Rapid strep positive. Patient is not allergic to penicillin, We'll treat with penicillin. Diflucan also given to prevent yeast infection. Work note given to remain home tomorrow. Patient may  return to work on Tuesday. Patient states understanding and will call with questions or problems. Patient instructed to call or follow up with his/her primary care doctor if failure to improve or change in symptoms. Discharge instruction given.    Barry Dienes, NP 09/13/16 1100

## 2016-09-15 ENCOUNTER — Ambulatory Visit
Admission: EM | Admit: 2016-09-15 | Discharge: 2016-09-15 | Disposition: A | Discharge status: Home / Self Care | Payer: 59 | Attending: Family Medicine | Admitting: Family Medicine

## 2016-09-15 DIAGNOSIS — J020 Streptococcal pharyngitis: Secondary | ICD-10-CM

## 2016-09-15 DIAGNOSIS — J039 Acute tonsillitis, unspecified: Secondary | ICD-10-CM | POA: Diagnosis not present

## 2016-09-15 LAB — CBC WITH DIFFERENTIAL/PLATELET
BASOS ABS: 0.1 10*3/uL (ref 0–0.1)
Basophils Relative: 1 %
EOS PCT: 0 %
Eosinophils Absolute: 0 10*3/uL (ref 0–0.7)
HCT: 36.6 % (ref 35.0–47.0)
HEMOGLOBIN: 11.4 g/dL — AB (ref 12.0–16.0)
LYMPHS PCT: 6 %
Lymphs Abs: 1.1 10*3/uL (ref 1.0–3.6)
MCH: 22.3 pg — ABNORMAL LOW (ref 26.0–34.0)
MCHC: 31 g/dL — ABNORMAL LOW (ref 32.0–36.0)
MCV: 71.8 fL — AB (ref 80.0–100.0)
Monocytes Absolute: 1.5 10*3/uL — ABNORMAL HIGH (ref 0.2–0.9)
Monocytes Relative: 9 %
NEUTROS PCT: 84 %
Neutro Abs: 14.8 10*3/uL — ABNORMAL HIGH (ref 1.4–6.5)
PLATELETS: 206 10*3/uL (ref 150–440)
RBC: 5.1 MIL/uL (ref 3.80–5.20)
RDW: 14.2 % (ref 11.5–14.5)
WBC: 17.6 10*3/uL — AB (ref 3.6–11.0)

## 2016-09-15 LAB — MONONUCLEOSIS SCREEN: Mono Screen: NEGATIVE

## 2016-09-15 MED ORDER — CEFUROXIME AXETIL 500 MG PO TABS: 500.0000 mg | ORAL_TABLET | Freq: Two times a day (BID) | ORAL | 0 refills | Status: DC

## 2016-09-15 MED ORDER — PENICILLIN G BENZATHINE 1200000 UNIT/2ML IM SUSP
1.2000 10*6.[IU] | Freq: Once | INTRAMUSCULAR | Status: AC
Start: 2016-09-15 — End: 2016-09-15
  Administered 2016-09-15: 1.2 10*6.[IU] via INTRAMUSCULAR

## 2016-09-15 NOTE — ED Triage Notes (Signed)
Pt was seen on New Years Eve and tested positive for strep but she says her symptoms arent getting any better. Sore throat and ear pain.

## 2016-09-15 NOTE — ED Provider Notes (Signed)
[facility]    CSN: [id number] Arrival date & time: 09/15/16  1218     History   Chief Complaint Chief Complaint  Patient presents with  . Sore Throat  . Otalgia    left    HPI Miranda Duncan is a 31 y.o. female.   She is here because of sore throat. She states the sore throat started Friday night before New Year's. She was seen on Sunday and place on Pen-Vee K 500 mg twice a day. She states that she requested a shot of penicillin but was told he uses good distance children. Her sore throat persisted and continued and gotten worse. She is was go to work today but felt miserable and felt that that was getting much worse. No known drug allergies does not smoke no pertinent family medical history pertinent to today's visit. She also reports having some left ear pain was told this probably from pressure from the tonsils. She is gravida 1 para 1 and she is on birth control. There is diabetes in grandmother and grandfather   The history is provided by the patient.  Sore Throat  This is a new problem. The current episode started more than 2 days ago. The problem occurs constantly. Pertinent negatives include no chest pain, no abdominal pain, no headaches and no shortness of breath. Nothing aggravates the symptoms. Nothing relieves the symptoms. She has tried nothing for the symptoms.  Otalgia  Associated symptoms: no abdominal pain and no headaches     Past Medical History:  Diagnosis Date  . Anemia    during pregnancy    Patient Active Problem List   Diagnosis Date Noted  . Fibroadenoma of breast 12/10/2012  . Headache(784.0) 05/30/2012    Past Surgical History:  Procedure Laterality Date  . BREAST BIOPSY Right 12-07-12   benign    OB History    Gravida Para Term Preterm AB Living   1 1           SAB TAB Ectopic Multiple Live Births                  Obstetric Comments   Age at first pregnancy 54 Age first menstrual period 17       Home Medications     Prior to Admission medications   Medication Sig Start Date End Date Taking? Authorizing Provider  norgestimate-ethinyl estradiol (ORTHO-CYCLEN,SPRINTEC,PREVIFEM) 0.25-35 MG-MCG tablet Take 1 tablet by mouth daily.   Yes Historical Provider, MD  penicillin v potassium (VEETID) 500 MG tablet Take 1 tablet (500 mg total) by mouth 2 (two) times daily. 09/13/16 09/23/16 Yes Barry Dienes, NP  cefUROXime (CEFTIN) 500 MG tablet Take 1 tablet (500 mg total) by mouth 2 (two) times daily. 09/15/16   Frederich Cha, MD    Family History Family History  Problem Relation Age of Onset  . Diabetes Paternal Grandmother   . Hypertension Paternal Grandmother   . Diabetes Paternal Grandfather   . Cancer Paternal Grandfather     prostate  . Heart disease Neg Hx   . Migraines Neg Hx     Social History Social History  Substance Use Topics  . Smoking status: Never Smoker  . Smokeless tobacco: Never Used  . Alcohol use Yes     Comment: rare--- once or twice a month     Allergies   Patient has no known allergies.   Review of Systems Review of Systems  HENT: Positive for ear pain.   Respiratory: Negative  for shortness of breath.   Cardiovascular: Negative for chest pain.  Gastrointestinal: Negative for abdominal pain.  Neurological: Negative for headaches.  All other systems reviewed and are negative.    Physical Exam Triage Vital Signs ED Triage Vitals [09/15/16 1402]  Enc Vitals Group     BP 128/68     Pulse Rate 93     Resp 18     Temp 99.3 F (37.4 C)     Temp Source Oral     SpO2 100 %     Weight 143 lb (64.9 kg)     Height 5\' 11"  (1.803 m)     Head Circumference      Peak Flow      Pain Score 5     Pain Loc      Pain Edu?      Excl. in [place]?    No data found.   Updated Vital Signs BP 128/68 (BP Location: Left Arm)   Pulse 93   Temp 99.3 F (37.4 C) (Oral)   Resp 18   Ht 5\' 11"  (1.803 m)   Wt 143 lb (64.9 kg)   LMP 08/30/2016 (Approximate)   SpO2 100%   BMI 19.94  kg/m   Visual Acuity Right Eye Distance:   Left Eye Distance:   Bilateral Distance:    Right Eye Near:   Left Eye Near:    Bilateral Near:     Physical Exam  Constitutional: She appears well-developed and well-nourished. She is active. She has a sickly appearance. She appears ill.  HENT:  Head: Normocephalic and atraumatic.  Right Ear: Hearing, tympanic membrane, external ear and ear canal normal.  Left Ear: Hearing, external ear and ear canal normal. Tympanic membrane is erythematous.  Nose: Nose normal.  Mouth/Throat: Oral lesions present. No dental abscesses. Posterior oropharyngeal edema and posterior oropharyngeal erythema present. No tonsillar abscesses.    Both tonsils had eschar-like material present do not see signs of swelling beyond the tonsillar beds themselves is no deviation of the possibilities either. White count was elevated 17,000 and patient's low-grade fever  Eyes: EOM are normal. Pupils are equal, round, and reactive to light.  Pulmonary/Chest: Effort normal.  Musculoskeletal: Normal range of motion.  Neurological: She is alert.  Skin: Skin is warm.  Psychiatric: She has a normal mood and affect.  Vitals reviewed.    UC Treatments / Results  Labs (all labs ordered are listed, but only abnormal results are displayed) Labs Reviewed  CBC WITH DIFFERENTIAL/PLATELET - Abnormal; Notable for the following:       Result Value   WBC 17.6 (*)    Hemoglobin 11.4 (*)    MCV 71.8 (*)    MCH 22.3 (*)    MCHC 31.0 (*)    Neutro Abs 14.8 (*)    Monocytes Absolute 1.5 (*)    All other components within normal limits  MONONUCLEOSIS SCREEN    EKG  EKG Interpretation None       Radiology No results found.  Procedures Procedures (including critical care time)  Medications Ordered in UC Medications  penicillin g benzathine (BICILLIN LA) 1200000 UNIT/2ML injection 1.2 Million Units (1.2 Million Units Intramuscular Given 09/15/16 1451)     Initial  Impression / Assessment and Plan / UC Course  I have reviewed the triage vital signs and the nursing notes.  Pertinent labs & imaging results that were available during my care of the patient were reviewed by me and considered in my medical decision  making (see chart for details).    Results for orders placed or performed during the hospital encounter of 09/15/16  CBC with Differential  Result Value Ref Range   WBC 17.6 (H) 3.6 - 11.0 K/uL   RBC 5.10 3.80 - 5.20 MIL/uL   Hemoglobin 11.4 (L) 12.0 - 16.0 g/dL   HCT 36.6 35.0 - 47.0 %   MCV 71.8 (L) 80.0 - 100.0 fL   MCH 22.3 (L) 26.0 - 34.0 pg   MCHC 31.0 (L) 32.0 - 36.0 g/dL   RDW 14.2 11.5 - 14.5 %   Platelets 206 150 - 440 K/uL   Neutrophils Relative % 84 %   Neutro Abs 14.8 (H) 1.4 - 6.5 K/uL   Lymphocytes Relative 6 %   Lymphs Abs 1.1 1.0 - 3.6 K/uL   Monocytes Relative 9 %   Monocytes Absolute 1.5 (H) 0.2 - 0.9 K/uL   Eosinophils Relative 0 %   Eosinophils Absolute 0.0 0 - 0.7 K/uL   Basophils Relative 1 %   Basophils Absolute 0.1 0 - 0.1 K/uL  Mononucleosis screen  Result Value Ref Range   Mono Screen NEGATIVE NEGATIVE   Clinical Course    Do not think is a tonsillar abscess at this time however I'm concerned because of the elevated white count the clinical presentation of her throat and the fact that she's been on penicillin but probably a subtherapeutic dose of penicillin for the last 2 days. Stop the penicillin will give a shot of LA Bicillin 1.2 milliunits but because of the exposure earlier to a therapeutic Pen-Vee K will also place on Ceftin 500 mg 1 tablet twice a day for 10 days. Stress to her regular work note for Tuesday Wednesday and Thursday she's not better Thursday to preset me know and probably will need to get her into see ears nose and throat doctor. Follow-up with her PCP in about 3 weeks for repeat strep test for proof of care. Mono test was negative  Final Clinical Impressions(s) / UC Diagnoses   Final  diagnoses:  Strep pharyngitis  Exudative tonsillitis    New Prescriptions New Prescriptions   CEFUROXIME (CEFTIN) 500 MG TABLET    Take 1 tablet (500 mg total) by mouth 2 (two) times daily.     Note: This dictation was prepared with Dragon dictation along with smaller phrase technology. Any transcriptional errors that result from this process are unintentional.   Frederich Cha, MD 09/15/16 [phone]

## 2017-01-06 ENCOUNTER — Other Ambulatory Visit: Payer: Self-pay | Admitting: Obstetrics and Gynecology

## 2017-01-08 ENCOUNTER — Telehealth: Payer: Self-pay

## 2017-01-08 MED ORDER — NORGESTIMATE-ETH ESTRADIOL 0.25-35 MG-MCG PO TABS: 1.0000 | ORAL_TABLET | Freq: Every day | ORAL | 1 refills | Status: DC

## 2017-01-08 NOTE — Telephone Encounter (Signed)
Pt calling for refill.  Rx is out of date and pharm hasn't heard from [place].  Refill of bc eRx'd.  Pt aware

## 2017-03-18 ENCOUNTER — Other Ambulatory Visit: Payer: Self-pay

## 2017-03-18 MED ORDER — NORGESTIMATE-ETH ESTRADIOL 0.25-35 MG-MCG PO TABS: 1.0000 | ORAL_TABLET | Freq: Every day | ORAL | 2 refills | Status: DC

## 2017-03-18 MED ORDER — VALACYCLOVIR HCL 500 MG PO TABS: 500.0000 mg | ORAL_TABLET | Freq: Every day | ORAL | 0 refills | Status: DC

## 2017-03-18 NOTE — Telephone Encounter (Signed)
Pt called triage line stating she has an annual scheduled  For 9/10. She needs a refill on birthcontrol/Valtrex.    Pt aware refills have been sent in

## 2017-04-30 ENCOUNTER — Other Ambulatory Visit: Payer: Self-pay

## 2017-04-30 MED ORDER — VALACYCLOVIR HCL 500 MG PO TABS: 500.0000 mg | ORAL_TABLET | Freq: Every day | ORAL | 0 refills | Status: DC

## 2017-05-24 ENCOUNTER — Ambulatory Visit (INDEPENDENT_AMBULATORY_CARE_PROVIDER_SITE_OTHER): Payer: Managed Care, Other (non HMO) | Admitting: Obstetrics and Gynecology

## 2017-05-24 ENCOUNTER — Encounter: Payer: Self-pay | Admitting: Obstetrics and Gynecology

## 2017-05-24 VITALS — BP 98/58 | HR 84 | Ht 71.0 in | Wt 154.0 lb

## 2017-05-24 DIAGNOSIS — A6004 Herpesviral vulvovaginitis: Secondary | ICD-10-CM

## 2017-05-24 DIAGNOSIS — Z01419 Encounter for gynecological examination (general) (routine) without abnormal findings: Secondary | ICD-10-CM

## 2017-05-24 DIAGNOSIS — Z3041 Encounter for surveillance of contraceptive pills: Secondary | ICD-10-CM | POA: Diagnosis not present

## 2017-05-24 MED ORDER — NORGESTIMATE-ETH ESTRADIOL 0.25-35 MG-MCG PO TABS: 1.0000 | ORAL_TABLET | Freq: Every day | ORAL | 3 refills | Status: DC

## 2017-05-24 MED ORDER — VALACYCLOVIR HCL 500 MG PO TABS: 500.0000 mg | ORAL_TABLET | Freq: Every day | ORAL | 3 refills | Status: DC

## 2017-05-24 NOTE — Progress Notes (Signed)
PCP:  Venia Carbon, MD   Chief Complaint  Patient presents with  . Gynecologic Exam    No Complaints     HPI:      [name] is a 31 y.o. G1P1 who LMP was Patient's last menstrual period was 05/18/2017., presents today for her annual examination.  Her menses are regular every 28-30 days, lasting 5 days.  Dysmenorrhea none. She does not have intermenstrual bleeding.  Sex activity: not sexually active. Declines STD testing. Last Pap: March 03, 2016  Results were: no abnormalities /neg HPV DNA  Hx of STDs: HSV, takes valtrex daily as preventive with sx control.  There is no FH of breast cancer. There is no FH of ovarian cancer. The patient does do self-breast exams.  Tobacco use: The patient denies current or previous tobacco use. Alcohol use: social drinker No drug use.  Exercise: not active  She does get adequate calcium and Vitamin D in her diet.    Past Medical History:  Diagnosis Date  . Anemia    during pregnancy  . 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 BREAST BIOPSY Right 12-07-12   benign    Family History  Problem Relation Age of Onset  . Diabetes Paternal Grandmother   . Hypertension Paternal Grandmother   . Diabetes Paternal Grandfather   . Cancer Paternal Grandfather        prostate  . Heart disease Neg Hx   . Migraines Neg Hx     Social History   Social History  . Marital status: Single    Spouse name: N/A  . Number of children: 1  . Years of education: N/A   Occupational History  . [place] History Main Topics  . Smoking status: Never Smoker  . Smokeless tobacco: Never Used  . Alcohol use Yes     Comment: rare--- once or twice a month  . Drug use: No  . Sexual activity: Not on file   Other Topics Concern  . Not on file   Social History Narrative  . No narrative on file    Current Meds  Medication Sig  . norgestimate-ethinyl estradiol  (ORTHO-CYCLEN,SPRINTEC,PREVIFEM) 0.25-35 MG-MCG tablet Take 1 tablet by mouth daily.  . valACYclovir (VALTREX) 500 MG tablet Take 1 tablet (500 mg total) by mouth daily.  . [DISCONTINUED] norgestimate-ethinyl estradiol (ORTHO-CYCLEN,SPRINTEC,PREVIFEM) 0.25-35 MG-MCG tablet Take 1 tablet by mouth daily.  . [DISCONTINUED] valACYclovir (VALTREX) 500 MG tablet Take 1 tablet (500 mg total) by mouth daily.     ROS:  Review of Systems  Constitutional: Negative for fatigue, fever and unexpected weight change.  Respiratory: Negative for cough, shortness of breath and wheezing.   Cardiovascular: Negative for chest pain, palpitations and leg swelling.  Gastrointestinal: Negative for blood in stool, constipation, diarrhea, nausea and vomiting.  Endocrine: Negative for cold intolerance, heat intolerance and polyuria.  Genitourinary: Negative for dyspareunia, dysuria, flank pain, frequency, genital sores, hematuria, menstrual problem, pelvic pain, urgency, vaginal bleeding, vaginal discharge and vaginal pain.  Musculoskeletal: Negative for back pain, joint swelling and myalgias.  Skin: Negative for rash.  Neurological: Negative for dizziness, syncope, light-headedness, numbness and headaches.  Hematological: Negative for adenopathy.  Psychiatric/Behavioral: Negative for agitation, confusion, sleep disturbance and suicidal ideas. The patient is not nervous/anxious.      Objective: BP (!) 98/58 (BP Location: Left Arm, Patient Position: Sitting, Cuff Size: Normal)   Pulse 84  Ht 5\' 11"  (1.803 m)   Wt 154 lb (69.9 kg)   LMP 05/18/2017   BMI 21.48 kg/m    Physical Exam  Constitutional: She is oriented to person, place, and time. She appears well-developed and well-nourished.  Genitourinary: Vagina normal and uterus normal. There is no rash or tenderness on the right labia. There is no rash or tenderness on the left labia. No erythema or tenderness in the vagina. No vaginal discharge found. Right  adnexum does not display mass and does not display tenderness. Left adnexum does not display mass and does not display tenderness. Cervix does not exhibit motion tenderness or polyp. Uterus is not enlarged or tender.  Neck: Normal range of motion. No thyromegaly present.  Cardiovascular: Normal rate, regular rhythm and normal heart sounds.   No murmur heard. Pulmonary/Chest: Effort normal and breath sounds normal. Right breast exhibits no mass, no nipple discharge, no skin change and no tenderness. Left breast exhibits no mass, no nipple discharge, no skin change and no tenderness.  Abdominal: Soft. There is no tenderness. There is no guarding.  Musculoskeletal: Normal range of motion.  Neurological: She is alert and oriented to person, place, and time. No cranial nerve deficit.  Psychiatric: She has a normal mood and affect. Her behavior is normal.  Vitals reviewed.   Assessment/Plan: Encounter for annual routine gynecological examination  Encounter for surveillance of contraceptive pills - OCP RF. - Plan: norgestimate-ethinyl estradiol (ORTHO-CYCLEN,SPRINTEC,PREVIFEM) 0.25-35 MG-MCG tablet  Herpes simplex vulvovaginitis - Rx RF valtrex. - Plan: valACYclovir (VALTREX) 500 MG tablet  Meds ordered this encounter  Medications  . valACYclovir (VALTREX) 500 MG tablet    Sig: Take 1 tablet (500 mg total) by mouth daily.    Dispense:  90 tablet    Refill:  3  . norgestimate-ethinyl estradiol (ORTHO-CYCLEN,SPRINTEC,PREVIFEM) 0.25-35 MG-MCG tablet    Sig: Take 1 tablet by mouth daily.    Dispense:  3 Package    Refill:  3             GYN counsel adequate intake of calcium and vitamin D, diet and exercise     F/U  Return in about 1 year (around 05/24/2018).  Cutberto Winfree B. Tessa Seaberry, PA-C 05/24/2017 2:05 PM

## 2017-06-08 ENCOUNTER — Telehealth: Payer: Self-pay

## 2017-06-08 NOTE — Telephone Encounter (Signed)
Pt's 90d rx is not at pharm.  [phone]  Left detailed msg that both rxs were eRx'd, both c 90d, both recv'd by pharm which we have as [facility].  To call back if pharm incorrect or if pharm still doesn't have rxs and I'll verbally call in order.

## 2017-06-10 NOTE — Telephone Encounter (Signed)
No response from pt.  Msg closed.

## 2017-08-04 ENCOUNTER — Other Ambulatory Visit: Payer: Self-pay

## 2017-08-04 ENCOUNTER — Telehealth: Payer: Self-pay | Admitting: Obstetrics and Gynecology

## 2017-08-04 DIAGNOSIS — A6004 Herpesviral vulvovaginitis: Secondary | ICD-10-CM

## 2017-08-04 DIAGNOSIS — Z3041 Encounter for surveillance of contraceptive pills: Secondary | ICD-10-CM

## 2017-08-04 MED ORDER — VALACYCLOVIR HCL 500 MG PO TABS: 500.0000 mg | ORAL_TABLET | Freq: Every day | ORAL | 3 refills | Status: AC

## 2017-08-04 MED ORDER — NORGESTIMATE-ETH ESTRADIOL 0.25-35 MG-MCG PO TABS: 1.0000 | ORAL_TABLET | Freq: Every day | ORAL | 3 refills | Status: DC

## 2017-08-04 NOTE — Telephone Encounter (Signed)
Pt is calling needing a refill on her Valtrex. [facility] [address].

## 2017-11-08 ENCOUNTER — Other Ambulatory Visit: Payer: Self-pay

## 2017-11-08 DIAGNOSIS — Z3041 Encounter for surveillance of contraceptive pills: Secondary | ICD-10-CM

## 2017-11-08 MED ORDER — NORGESTIMATE-ETH ESTRADIOL 0.25-35 MG-MCG PO TABS: 1.0000 | ORAL_TABLET | Freq: Every day | ORAL | 2 refills | Status: DC

## 2017-11-08 MED ORDER — VALACYCLOVIR HCL 500 MG PO TABS: 500.0000 mg | ORAL_TABLET | Freq: Every day | ORAL | 2 refills | Status: DC

## 2017-11-08 NOTE — Telephone Encounter (Signed)
Pt called requesting refills. Advised previous 90 day supply with refills sent to pharmacy but pt stated they did not have rf. Resent to pharmacy.

## 2018-07-11 ENCOUNTER — Other Ambulatory Visit: Payer: Self-pay | Admitting: Obstetrics and Gynecology

## 2018-07-11 ENCOUNTER — Telehealth: Payer: Self-pay

## 2018-07-11 DIAGNOSIS — Z3041 Encounter for surveillance of contraceptive pills: Secondary | ICD-10-CM

## 2018-07-11 MED ORDER — NORGESTIMATE-ETH ESTRADIOL 0.25-35 MG-MCG PO TABS: 1.0000 | ORAL_TABLET | Freq: Every day | ORAL | 0 refills | Status: DC

## 2018-07-11 MED ORDER — VALACYCLOVIR HCL 500 MG PO TABS: 500.0000 mg | ORAL_TABLET | Freq: Every day | ORAL | 0 refills | Status: AC

## 2018-07-11 NOTE — Telephone Encounter (Signed)
Rxs eRxd

## 2018-07-11 NOTE — Telephone Encounter (Signed)
Pt states she scheduled her AE for 08/01/18. She is requesting a rf of both of her rx's. YB#[phone]

## 2018-07-11 NOTE — Progress Notes (Signed)
Rx RF till annual 

## 2018-08-01 ENCOUNTER — Ambulatory Visit: Payer: Managed Care, Other (non HMO) | Admitting: Obstetrics and Gynecology

## 2018-08-01 ENCOUNTER — Encounter: Payer: Self-pay | Admitting: Obstetrics and Gynecology

## 2018-08-01 NOTE — Progress Notes (Deleted)
PCP:  Venia Carbon, MD   No chief complaint on file.    HPI:      Miranda Duncan is a 32 y.o. G1P1 who LMP was No LMP recorded., presents today for her annual examination.  Her menses are regular every 28-30 days, lasting 5 days.  Dysmenorrhea none. She does not have intermenstrual bleeding.  Sex activity: not sexually active. Declines STD testing. Last Pap: March 03, 2016  Results were: no abnormalities /neg HPV DNA  Hx of STDs: HSV, takes valtrex daily as preventive with sx control.  There is no FH of breast cancer. There is no FH of ovarian cancer. The patient does do self-breast exams.  Tobacco use: The patient denies current or previous tobacco use. Alcohol use: social drinker No drug use.  Exercise: not active  She does get adequate calcium and Vitamin D in her diet.    Past Medical History:  Diagnosis Date  . Anemia    during pregnancy  . Breast mass    right side at 7'oclock  . 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 BREAST BIOPSY Right 12-07-12   benign  . OTHER SURGICAL HISTORY     Abortion    Family History  Problem Relation Age of Onset  . Diabetes Paternal Grandmother   . Hypertension Paternal Grandmother   . Diabetes Paternal Grandfather   . Cancer Paternal Grandfather        prostate  . Heart disease Neg Hx   . Migraines Neg Hx     Social History   Socioeconomic History  . Marital status: Single    Spouse name: Not on file  . Number of children: 1  . Years of education: Not on file  . Highest education level: Not on file  Occupational History  . Occupation: Research scientist (physical sciences): [employer]  Social Needs  . Financial resource strain: Not on file  . Food insecurity:    Worry: Not on file    Inability: Not on file  . Transportation needs:    Medical: Not on file    Non-medical: Not on file  Tobacco Use  . Smoking status: Never Smoker  . Smokeless tobacco: Never Used    Substance and Sexual Activity  . Alcohol use: Yes    Comment: rare--- once or twice a month  . Drug use: No  . Sexual activity: Not on file  Lifestyle  . Physical activity:    Days per week: Not on file    Minutes per session: Not on file  . Stress: Not on file  Relationships  . Social connections:    Talks on phone: Not on file    Gets together: Not on file    Attends religious service: Not on file    Active member of club or organization: Not on file    Attends meetings of clubs or organizations: Not on file    Relationship status: Not on file  . Intimate partner violence:    Fear of current or ex partner: Not on file    Emotionally abused: Not on file    Physically abused: Not on file    Forced sexual activity: Not on file  Other Topics Concern  . Not on file  Social History Narrative  . Not on file    No outpatient medications have been marked as taking for the 08/01/18 encounter (Appointment) with Copland, Deirdre Evener, PA-C.  ROS:  Review of Systems  Constitutional: Negative for fatigue, fever and unexpected weight change.  Respiratory: Negative for cough, shortness of breath and wheezing.   Cardiovascular: Negative for chest pain, palpitations and leg swelling.  Gastrointestinal: Negative for blood in stool, constipation, diarrhea, nausea and vomiting.  Endocrine: Negative for cold intolerance, heat intolerance and polyuria.  Genitourinary: Negative for dyspareunia, dysuria, flank pain, frequency, genital sores, hematuria, menstrual problem, pelvic pain, urgency, vaginal bleeding, vaginal discharge and vaginal pain.  Musculoskeletal: Negative for back pain, joint swelling and myalgias.  Skin: Negative for rash.  Neurological: Negative for dizziness, syncope, light-headedness, numbness and headaches.  Hematological: Negative for adenopathy.  Psychiatric/Behavioral: Negative for agitation, confusion, sleep disturbance and suicidal ideas. The patient is not  nervous/anxious.      Objective: There were no vitals taken for this visit.   Physical Exam  Constitutional: She is oriented to person, place, and time. She appears well-developed and well-nourished.  Genitourinary: Vagina normal and uterus normal. There is no rash or tenderness on the right labia. There is no rash or tenderness on the left labia. No erythema or tenderness in the vagina. No vaginal discharge found. Right adnexum does not display mass and does not display tenderness. Left adnexum does not display mass and does not display tenderness. Cervix does not exhibit motion tenderness or polyp. Uterus is not enlarged or tender.  Neck: Normal range of motion. No thyromegaly present.  Cardiovascular: Normal rate, regular rhythm and normal heart sounds.  No murmur heard. Pulmonary/Chest: Effort normal and breath sounds normal. Right breast exhibits no mass, no nipple discharge, no skin change and no tenderness. Left breast exhibits no mass, no nipple discharge, no skin change and no tenderness.  Abdominal: Soft. There is no tenderness. There is no guarding.  Musculoskeletal: Normal range of motion.  Neurological: She is alert and oriented to person, place, and time. No cranial nerve deficit.  Psychiatric: She has a normal mood and affect. Her behavior is normal.  Vitals reviewed.   Assessment/Plan: No diagnosis found.  No orders of the defined types were placed in this encounter.            GYN counsel adequate intake of calcium and vitamin D, diet and exercise     F/U  No follow-ups on file.  Alicia B. Copland, PA-C 08/01/2018 11:02 AM

## 2018-08-30 ENCOUNTER — Encounter: Payer: Self-pay | Admitting: Obstetrics and Gynecology

## 2018-08-30 ENCOUNTER — Ambulatory Visit (INDEPENDENT_AMBULATORY_CARE_PROVIDER_SITE_OTHER): Payer: Managed Care, Other (non HMO) | Admitting: Obstetrics and Gynecology

## 2018-08-30 VITALS — BP 108/64 | HR 90 | Ht 71.0 in | Wt 152.0 lb

## 2018-08-30 DIAGNOSIS — Z3041 Encounter for surveillance of contraceptive pills: Secondary | ICD-10-CM

## 2018-08-30 DIAGNOSIS — Z01419 Encounter for gynecological examination (general) (routine) without abnormal findings: Secondary | ICD-10-CM

## 2018-08-30 DIAGNOSIS — A6004 Herpesviral vulvovaginitis: Secondary | ICD-10-CM

## 2018-08-30 MED ORDER — NORGESTIMATE-ETH ESTRADIOL 0.25-35 MG-MCG PO TABS: 1.0000 | ORAL_TABLET | Freq: Every day | ORAL | 3 refills | Status: AC

## 2018-08-30 NOTE — Progress Notes (Signed)
PCP:  Venia Carbon, MD   Chief Complaint  Patient presents with  . Gynecologic Exam  . [employer] Employee     HPI:      [name] is a 32 y.o. G1P1 who LMP was Patient's last menstrual period was 08/17/2018 (approximate)., presents today for her annual examination.  Her menses are regular every 28-30 days, lasting 5 days.  Dysmenorrhea mild. She does not have intermenstrual bleeding. On OCPs for cycle control.   Sex activity: not sexually active. Last Pap: March 03, 2016  Results were: no abnormalities /neg HPV DNA  Hx of STDs: HSV, takes valtrex prn. Doesn't need RF today.  There is no FH of breast cancer. There is no FH of ovarian cancer. The patient does do self-breast exams.  Tobacco use: The patient denies current or previous tobacco use. Alcohol use: social drinker No drug use.  Exercise: not active  She does get adequate calcium but not Vitamin D in her diet.   Past Medical History:  Diagnosis Date  . Anemia    during pregnancy  . Breast mass    right side at 7'oclock  . 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 BREAST BIOPSY Right 12-07-12   benign  . OTHER SURGICAL HISTORY     Abortion    Family History  Problem Relation Age of Onset  . Diabetes Paternal Grandmother   . Hypertension Paternal Grandmother   . Diabetes Paternal Grandfather   . Cancer Paternal Grandfather        prostate  . Heart disease Neg Hx   . Migraines Neg Hx     Social History   Socioeconomic History  . Marital status: Single    Spouse name: Not on file  . Number of children: 1  . Years of education: Not on file  . Highest education level: Not on file  Occupational History  . Occupation: Research scientist (physical sciences): [employer]  Social Needs  . Financial resource strain: Not on file  . Food insecurity:    Worry: Not on file    Inability: Not on file  . Transportation needs:    Medical: Not on file    Non-medical:  Not on file  Tobacco Use  . Smoking status: Never Smoker  . Smokeless tobacco: Never Used  Substance and Sexual Activity  . Alcohol use: Yes    Comment: rare--- once or twice a month  . Drug use: No  . Sexual activity: Not Currently    Birth control/protection: Pill  Lifestyle  . Physical activity:    Days per week: Not on file    Minutes per session: Not on file  . Stress: Not on file  Relationships  . Social connections:    Talks on phone: Not on file    Gets together: Not on file    Attends religious service: Not on file    Active member of club or organization: Not on file    Attends meetings of clubs or organizations: Not on file    Relationship status: Not on file  . Intimate partner violence:    Fear of current or ex partner: Not on file    Emotionally abused: Not on file    Physically abused: Not on file    Forced sexual activity: Not on file  Other Topics Concern  . Not on file  Social History Narrative  . Not on file  Current Meds  Medication Sig  . norgestimate-ethinyl estradiol (ORTHO-CYCLEN,SPRINTEC,PREVIFEM) 0.25-35 MG-MCG tablet Take 1 tablet by mouth daily.  . valACYclovir (VALTREX) 500 MG tablet Take 1 tablet (500 mg total) by mouth daily.  . [DISCONTINUED] norgestimate-ethinyl estradiol (ORTHO-CYCLEN,SPRINTEC,PREVIFEM) 0.25-35 MG-MCG tablet Take 1 tablet by mouth daily.     ROS:  Review of Systems  Constitutional: Negative for fatigue, fever and unexpected weight change.  Respiratory: Negative for cough, shortness of breath and wheezing.   Cardiovascular: Negative for chest pain, palpitations and leg swelling.  Gastrointestinal: Negative for blood in stool, constipation, diarrhea, nausea and vomiting.  Endocrine: Negative for cold intolerance, heat intolerance and polyuria.  Genitourinary: Negative for dyspareunia, dysuria, flank pain, frequency, genital sores, hematuria, menstrual problem, pelvic pain, urgency, vaginal bleeding, vaginal discharge  and vaginal pain.  Musculoskeletal: Negative for back pain, joint swelling and myalgias.  Skin: Negative for rash.  Neurological: Negative for dizziness, syncope, light-headedness, numbness and headaches.  Hematological: Negative for adenopathy.  Psychiatric/Behavioral: Negative for agitation, confusion, sleep disturbance and suicidal ideas. The patient is not nervous/anxious.      Objective: BP 108/64   Pulse 90   Ht 5\' 11"  (1.803 m)   Wt 152 lb (68.9 kg)   LMP 08/17/2018 (Approximate)   BMI 21.20 kg/m    Physical Exam Constitutional:      Appearance: She is well-developed.  Genitourinary:     Vulva, vagina, uterus, right adnexa and left adnexa normal.     No vulval lesion or tenderness noted.     No vaginal discharge, erythema or tenderness.     No cervical motion tenderness or polyp.     Uterus is not enlarged or tender.     No right or left adnexal mass present.     Right adnexa not tender.     Left adnexa not tender.  Neck:     Musculoskeletal: Normal range of motion.     Thyroid: No thyromegaly.  Cardiovascular:     Rate and Rhythm: Normal rate and regular rhythm.     Heart sounds: Normal heart sounds. No murmur.  Pulmonary:     Effort: Pulmonary effort is normal.     Breath sounds: Normal breath sounds.  Chest:     Breasts:        Right: No mass, nipple discharge, skin change or tenderness.        Left: No mass, nipple discharge, skin change or tenderness.  Abdominal:     Palpations: Abdomen is soft.     Tenderness: There is no abdominal tenderness. There is no guarding.  Musculoskeletal: Normal range of motion.  Neurological:     Mental Status: She is alert and oriented to person, place, and time.     Cranial Nerves: No cranial nerve deficit.  Psychiatric:        Behavior: Behavior normal.  Vitals signs reviewed.     Assessment/Plan: Encounter for annual routine gynecological examination  Encounter for surveillance of contraceptive pills - OCP RF -  Plan: norgestimate-ethinyl estradiol (ORTHO-CYCLEN,SPRINTEC,PREVIFEM) 0.25-35 MG-MCG tablet  Herpes simplex vulvovaginitis - Will call for valtrex RF prn.  Meds ordered this encounter  Medications  . norgestimate-ethinyl estradiol (ORTHO-CYCLEN,SPRINTEC,PREVIFEM) 0.25-35 MG-MCG tablet    Sig: Take 1 tablet by mouth daily.    Dispense:  84 tablet    Refill:  3    Order Specific Question:   Supervising Provider    Answer:   Gae Dry [[id number]]  GYN counsel adequate intake of calcium and vitamin D, diet and exercise     F/U  Return in about 1 year (around 08/31/2019).  Caylan Chenard B. Khalea Ventura, PA-C 08/30/2018 10:35 AM

## 2018-08-30 NOTE — Patient Instructions (Signed)
I value your feedback and entrusting us with your care. If you get a [facility] patient survey, I would appreciate you taking the time to let us know about your experience today. Thank you! 

## 2020-04-01 ENCOUNTER — Other Ambulatory Visit: Payer: Self-pay

## 2020-04-01 ENCOUNTER — Ambulatory Visit (LOCAL_COMMUNITY_HEALTH_CENTER): Payer: Managed Care, Other (non HMO)

## 2020-04-01 DIAGNOSIS — Z23 Encounter for immunization: Secondary | ICD-10-CM

## 2023-10-31 ENCOUNTER — Emergency Department (HOSPITAL_COMMUNITY)
Admission: EM | Admit: 2023-10-31 | Discharge: 2023-10-31 | Discharge status: Against Medical Advice | Payer: Medicaid Other | Source: Home / Self Care
